# Patient Record
Sex: Male | Born: 1960 | Race: White | Hispanic: No | Marital: Single | Smoking: Current every day smoker
Health system: Southern US, Community
[De-identification: ages and names within clinical notes are randomized; demographics above are authoritative.]

## PROBLEM LIST (undated history)

## (undated) DIAGNOSIS — D696 Thrombocytopenia, unspecified: Secondary | ICD-10-CM

## (undated) DIAGNOSIS — G43909 Migraine, unspecified, not intractable, without status migrainosus: Secondary | ICD-10-CM

## (undated) HISTORY — PX: TONSILLECTOMY: SUR1361

---

## 2009-11-20 ENCOUNTER — Emergency Department: Payer: Self-pay | Admitting: Emergency Medicine

## 2014-01-24 ENCOUNTER — Emergency Department: Payer: Self-pay | Admitting: Emergency Medicine

## 2014-01-24 LAB — COMPREHENSIVE METABOLIC PANEL
ALBUMIN: 3.6 g/dL (ref 3.4–5.0)
ALK PHOS: 75 U/L
Anion Gap: 5 — ABNORMAL LOW (ref 7–16)
BILIRUBIN TOTAL: 0.7 mg/dL (ref 0.2–1.0)
BUN: 8 mg/dL (ref 7–18)
CALCIUM: 8.5 mg/dL (ref 8.5–10.1)
CHLORIDE: 106 mmol/L (ref 98–107)
CREATININE: 0.74 mg/dL (ref 0.60–1.30)
Co2: 31 mmol/L (ref 21–32)
EGFR (African American): >60
Glucose: 86 mg/dL (ref 65–99)
Osmolality: 281 (ref 275–301)
Potassium: 4.3 mmol/L (ref 3.5–5.1)
SGOT(AST): 103 U/L — ABNORMAL HIGH (ref 15–37)
SGPT (ALT): 149 U/L — ABNORMAL HIGH
SODIUM: 142 mmol/L (ref 136–145)
TOTAL PROTEIN: 8.3 g/dL — AB (ref 6.4–8.2)

## 2014-01-24 LAB — URINALYSIS, COMPLETE
Bacteria: NONE SEEN
Bilirubin,UR: NEGATIVE
Glucose,UR: NEGATIVE mg/dL (ref 0–75)
KETONE: NEGATIVE
Leukocyte Esterase: NEGATIVE
Nitrite: POSITIVE
Ph: 7 (ref 4.5–8.0)
Protein: 100
RBC,UR: 23035 /HPF (ref 0–5)
Specific Gravity: 1.009 (ref 1.003–1.030)
Squamous Epithelial: NONE SEEN
WBC UR: 682 /HPF (ref 0–5)

## 2014-01-24 LAB — PROTIME-INR
INR: 1.1
Prothrombin Time: 14.3 secs (ref 11.5–14.7)

## 2014-01-24 LAB — CBC
HCT: 46 % (ref 40.0–52.0)
HGB: 15.4 g/dL (ref 13.0–18.0)
MCH: 32.2 pg (ref 26.0–34.0)
MCHC: 33.4 g/dL (ref 32.0–36.0)
MCV: 96 fL (ref 80–100)
PLATELETS: 54 10*3/uL — AB (ref 150–440)
RBC: 4.78 10*6/uL (ref 4.40–5.90)
RDW: 13.4 % (ref 11.5–14.5)
WBC: 3.9 10*3/uL (ref 3.8–10.6)

## 2014-01-26 LAB — URINE CULTURE

## 2014-03-11 ENCOUNTER — Emergency Department: Payer: Self-pay | Admitting: Emergency Medicine

## 2014-03-11 LAB — COMPREHENSIVE METABOLIC PANEL
ALBUMIN: 3.5 g/dL (ref 3.4–5.0)
Alkaline Phosphatase: 93 U/L
Anion Gap: 6 — ABNORMAL LOW (ref 7–16)
BUN: 9 mg/dL (ref 7–18)
Bilirubin,Total: 1.8 mg/dL — ABNORMAL HIGH (ref 0.2–1.0)
CO2: 27 mmol/L (ref 21–32)
Calcium, Total: 8.3 mg/dL — ABNORMAL LOW (ref 8.5–10.1)
Chloride: 107 mmol/L (ref 98–107)
Creatinine: 0.69 mg/dL (ref 0.60–1.30)
EGFR (African American): >60
Glucose: 90 mg/dL (ref 65–99)
Osmolality: 278 (ref 275–301)
Potassium: 4 mmol/L (ref 3.5–5.1)
SGOT(AST): 95 U/L — ABNORMAL HIGH (ref 15–37)
SGPT (ALT): 68 U/L — ABNORMAL HIGH
SODIUM: 140 mmol/L (ref 136–145)
Total Protein: 8.2 g/dL (ref 6.4–8.2)

## 2014-03-11 LAB — CBC
HCT: 41.9 % (ref 40.0–52.0)
HGB: 14.2 g/dL (ref 13.0–18.0)
MCH: 32.5 pg (ref 26.0–34.0)
MCHC: 34 g/dL (ref 32.0–36.0)
MCV: 96 fL (ref 80–100)
PLATELETS: 54 10*3/uL — AB (ref 150–440)
RBC: 4.37 10*6/uL — ABNORMAL LOW (ref 4.40–5.90)
RDW: 14 % (ref 11.5–14.5)
WBC: 3.2 10*3/uL — AB (ref 3.8–10.6)

## 2014-03-11 LAB — URINALYSIS, COMPLETE
Bacteria: NONE SEEN
RBC,UR: 28497 /HPF (ref 0–5)
Specific Gravity: 1.017 (ref 1.003–1.030)
Squamous Epithelial: NONE SEEN
WBC UR: 1964 /HPF (ref 0–5)

## 2015-07-05 ENCOUNTER — Observation Stay
Admission: EM | Admit: 2015-07-05 | Discharge: 2015-07-06 | Disposition: A | Discharge status: Home / Self Care | Payer: Self-pay | Attending: Internal Medicine | Admitting: Internal Medicine

## 2015-07-05 ENCOUNTER — Encounter: Payer: Self-pay | Admitting: Emergency Medicine

## 2015-07-05 ENCOUNTER — Observation Stay: Payer: Self-pay

## 2015-07-05 DIAGNOSIS — K7460 Unspecified cirrhosis of liver: Secondary | ICD-10-CM | POA: Insufficient documentation

## 2015-07-05 DIAGNOSIS — F17210 Nicotine dependence, cigarettes, uncomplicated: Secondary | ICD-10-CM | POA: Insufficient documentation

## 2015-07-05 DIAGNOSIS — R310 Gross hematuria: Secondary | ICD-10-CM

## 2015-07-05 DIAGNOSIS — N200 Calculus of kidney: Secondary | ICD-10-CM | POA: Insufficient documentation

## 2015-07-05 DIAGNOSIS — G43909 Migraine, unspecified, not intractable, without status migrainosus: Secondary | ICD-10-CM | POA: Insufficient documentation

## 2015-07-05 DIAGNOSIS — R1031 Right lower quadrant pain: Secondary | ICD-10-CM

## 2015-07-05 DIAGNOSIS — R319 Hematuria, unspecified: Secondary | ICD-10-CM

## 2015-07-05 DIAGNOSIS — D696 Thrombocytopenia, unspecified: Secondary | ICD-10-CM

## 2015-07-05 DIAGNOSIS — R1030 Lower abdominal pain, unspecified: Secondary | ICD-10-CM | POA: Insufficient documentation

## 2015-07-05 DIAGNOSIS — K760 Fatty (change of) liver, not elsewhere classified: Secondary | ICD-10-CM | POA: Insufficient documentation

## 2015-07-05 DIAGNOSIS — D72819 Decreased white blood cell count, unspecified: Secondary | ICD-10-CM | POA: Insufficient documentation

## 2015-07-05 DIAGNOSIS — I8500 Esophageal varices without bleeding: Secondary | ICD-10-CM | POA: Insufficient documentation

## 2015-07-05 DIAGNOSIS — K766 Portal hypertension: Secondary | ICD-10-CM | POA: Insufficient documentation

## 2015-07-05 DIAGNOSIS — Z87442 Personal history of urinary calculi: Secondary | ICD-10-CM | POA: Insufficient documentation

## 2015-07-05 DIAGNOSIS — N3289 Other specified disorders of bladder: Secondary | ICD-10-CM | POA: Insufficient documentation

## 2015-07-05 DIAGNOSIS — Z466 Encounter for fitting and adjustment of urinary device: Secondary | ICD-10-CM

## 2015-07-05 DIAGNOSIS — R161 Splenomegaly, not elsewhere classified: Secondary | ICD-10-CM | POA: Insufficient documentation

## 2015-07-05 DIAGNOSIS — K745 Biliary cirrhosis, unspecified: Secondary | ICD-10-CM | POA: Insufficient documentation

## 2015-07-05 HISTORY — DX: Migraine, unspecified, not intractable, without status migrainosus: G43.909

## 2015-07-05 HISTORY — DX: Thrombocytopenia, unspecified: D69.6

## 2015-07-05 LAB — CBC WITH DIFFERENTIAL/PLATELET
BASOS ABS: 0 10*3/uL (ref 0–0.1)
EOS ABS: 0.1 10*3/uL (ref 0–0.7)
Eosinophils Relative: 2 %
HEMATOCRIT: 39.9 % — AB (ref 40.0–52.0)
HEMOGLOBIN: 13.8 g/dL (ref 13.0–18.0)
Lymphocytes Relative: 31 %
Lymphs Abs: 0.8 10*3/uL — ABNORMAL LOW (ref 1.0–3.6)
MCH: 32.7 pg (ref 26.0–34.0)
MCHC: 34.5 g/dL (ref 32.0–36.0)
MCV: 94.7 fL (ref 80.0–100.0)
Monocytes Absolute: 0.2 10*3/uL (ref 0.2–1.0)
NEUTROS ABS: 1.4 10*3/uL (ref 1.4–6.5)
Platelets: 53 10*3/uL — ABNORMAL LOW (ref 150–440)
RBC: 4.21 MIL/uL — AB (ref 4.40–5.90)
RDW: 14.8 % — ABNORMAL HIGH (ref 11.5–14.5)
WBC: 2.5 10*3/uL — AB (ref 3.8–10.6)

## 2015-07-05 LAB — COMPREHENSIVE METABOLIC PANEL
ALBUMIN: 3.8 g/dL (ref 3.5–5.0)
ALK PHOS: 70 U/L (ref 38–126)
ALT: 55 U/L (ref 17–63)
ANION GAP: 2 — AB (ref 5–15)
AST: 59 U/L — AB (ref 15–41)
BILIRUBIN TOTAL: 0.6 mg/dL (ref 0.3–1.2)
BUN: 16 mg/dL (ref 6–20)
CALCIUM: 8.7 mg/dL — AB (ref 8.9–10.3)
CO2: 26 mmol/L (ref 22–32)
CREATININE: 0.61 mg/dL (ref 0.61–1.24)
Chloride: 109 mmol/L (ref 101–111)
GFR calc Af Amer: >60 mL/min (ref >60)
GFR calc non Af Amer: >60 mL/min (ref >60)
GLUCOSE: 86 mg/dL (ref 65–99)
Potassium: 3.9 mmol/L (ref 3.5–5.1)
SODIUM: 137 mmol/L (ref 135–145)
TOTAL PROTEIN: 7.7 g/dL (ref 6.5–8.1)

## 2015-07-05 LAB — URINALYSIS COMPLETE WITH MICROSCOPIC (ARMC ONLY)
BACTERIA UA: NONE SEEN
BILIRUBIN URINE: NEGATIVE
GLUCOSE, UA: NEGATIVE mg/dL
Ketones, ur: NEGATIVE mg/dL
Leukocytes, UA: NEGATIVE
NITRITE: NEGATIVE
Protein, ur: 100 mg/dL — AB
Specific Gravity, Urine: 1.019 (ref 1.005–1.030)
Squamous Epithelial / LPF: NONE SEEN
pH: 6 (ref 5.0–8.0)

## 2015-07-05 LAB — PROTIME-INR
INR: 1.16
PROTHROMBIN TIME: 15 s (ref 11.4–15.0)

## 2015-07-05 MED ORDER — MORPHINE SULFATE (PF) 2 MG/ML IV SOLN
2.0000 mg | INTRAVENOUS | Status: DC | PRN
  Administered 2015-07-05 – 2015-07-06 (×3): 2 mg via INTRAVENOUS
  Filled 2015-07-05 (×3): qty 1

## 2015-07-05 MED ORDER — LEVOFLOXACIN IN D5W 500 MG/100ML IV SOLN
500.0000 mg | INTRAVENOUS | Status: DC
  Administered 2015-07-05: 500 mg via INTRAVENOUS
  Filled 2015-07-05 (×2): qty 100

## 2015-07-05 MED ORDER — KCL IN DEXTROSE-NACL 20-5-0.45 MEQ/L-%-% IV SOLN
INTRAVENOUS | Status: DC
  Administered 2015-07-05 – 2015-07-06 (×2): via INTRAVENOUS
  Filled 2015-07-05 (×4): qty 1000

## 2015-07-05 MED ORDER — MORPHINE SULFATE (PF) 4 MG/ML IV SOLN
4.0000 mg | Freq: Once | INTRAVENOUS | Status: AC
  Administered 2015-07-05: 4 mg via INTRAVENOUS
  Filled 2015-07-05: qty 1

## 2015-07-05 MED ORDER — DOCUSATE SODIUM 100 MG PO CAPS
100.0000 mg | ORAL_CAPSULE | Freq: Two times a day (BID) | ORAL | Status: DC
  Administered 2015-07-05: 100 mg via ORAL
  Filled 2015-07-05: qty 1

## 2015-07-05 MED ORDER — ONDANSETRON HCL 4 MG/2ML IJ SOLN: 4.0000 mg | Freq: Four times a day (QID) | INTRAMUSCULAR | Status: DC | PRN

## 2015-07-05 MED ORDER — ACETAMINOPHEN 325 MG PO TABS: 650.0000 mg | ORAL_TABLET | Freq: Four times a day (QID) | ORAL | Status: DC | PRN

## 2015-07-05 MED ORDER — BISACODYL 10 MG RE SUPP: 10.0000 mg | Freq: Every day | RECTAL | Status: DC | PRN

## 2015-07-05 MED ORDER — ACETAMINOPHEN 650 MG RE SUPP: 650.0000 mg | Freq: Four times a day (QID) | RECTAL | Status: DC | PRN

## 2015-07-05 MED ORDER — IOPAMIDOL (ISOVUE-300) INJECTION 61%
125.0000 mL | Freq: Once | INTRAVENOUS | Status: AC | PRN
  Administered 2015-07-05: 125 mL via INTRAVENOUS

## 2015-07-05 MED ORDER — ONDANSETRON HCL 4 MG PO TABS: 4.0000 mg | ORAL_TABLET | Freq: Four times a day (QID) | ORAL | Status: DC | PRN

## 2015-07-05 MED ORDER — ONDANSETRON HCL 4 MG/2ML IJ SOLN
4.0000 mg | Freq: Once | INTRAMUSCULAR | Status: AC
  Administered 2015-07-05: 4 mg via INTRAVENOUS
  Filled 2015-07-05: qty 2

## 2015-07-05 NOTE — H&P (Signed)
History and Physical    Andrew Davidsonodd W Gargis VHQ:[id number] DOB: 1960-12-27 DOA: 07/05/2015  Referring physician: Dr. Darnelle CatalanMalinda PCP: No primary care provider on file.  Specialists: none  Chief Complaint: difficulty passing urine with blood in the urine  HPI: Andrew Maynard is a 55 y.o. male has a past medical history significant for migraines and chronic thrombocytopenia now with acute onset lower abdominal pain with gross hematuria and urinary hesistancy. No fever. Denies N/V/D. Had similar episode 3 weeks ago with no definitive dx.  In the ER, foley catheter was placed by Urology with improvement of pain but patient noted to have low platelets. He is now admitted  Review of Systems: The patient denies anorexia, fever, weight loss,, vision loss, decreased hearing, hoarseness, chest pain, syncope, dyspnea on exertion, peripheral edema, balance deficits, hemoptysis, melena, hematochezia, severe indigestion/heartburn,, incontinence, genital sores, muscle weakness, suspicious skin lesions, transient blindness, difficulty walking, depression, unusual weight change, abnormal bleeding, enlarged lymph nodes, angioedema, and breast masses.   Past Medical History  Diagnosis Date  . Migraine   . Thrombocytopenia [facility](HCC)    Past Surgical History  Procedure Laterality Date  . Tonsillectomy     Social History:  reports that he has been smoking Cigarettes.  He has been smoking about 1.00 pack per day. He does not have any smokeless tobacco history on file. He reports that he does not drink alcohol. His drug history is not on file.  No Known Allergies  Family History  Problem Relation Age of Onset  . Hypertension Father     Prior to Admission medications   Not on File   Physical Exam: Filed Vitals:   07/05/15 1915 07/05/15 1930 07/05/15 1945 07/05/15 1959  BP: 141/92 129/81 143/95 143/95  Pulse: 61 53 57 57  Temp:      Resp:    16  Height:      Weight:      SpO2: 99% 98% 99% 100%     General:   No apparent distress, WDWN, [place]/AT  Eyes: PERRL, EOMI, no scleral icterus, conjunctiva clear  ENT: moist oropharynx without lesions, TM's benign, dentition good  Neck: supple, no lymphadenopathy  Cardiovascular: regular rate without MRG; 2+ peripheral pulses, no JVD, no peripheral edema  Respiratory: CTA biL, good air movement without wheezing, rhonchi or crackled, respiratory effort normal  Abdomen: soft, tender to palpation in the lower abdomen, positive bowel sounds, no guarding, no rebound, no organomegaly  Skin: no rashes or lesions  Musculoskeletal: normal bulk and tone, no joint swelling  Psychiatric: normal mood and affect, A&OX3  Neurologic: CN 2-12 grossly intact, Motor strength 5/5 in all 4 groups with symmetric DTR's and non-focal sensory exam  Labs on Admission:  Basic Metabolic Panel:  Recent Labs Lab 07/05/15 1826  NA 137  K 3.9  CL 109  CO2 26  GLUCOSE 86  BUN 16  CREATININE 0.61  CALCIUM 8.7*   Liver Function Tests:  Recent Labs Lab 07/05/15 1826  AST 59*  ALT 55  ALKPHOS 70  BILITOT 0.6  PROT 7.7  ALBUMIN 3.8   No results for input(s): LIPASE, AMYLASE in the last 168 hours. No results for input(s): AMMONIA in the last 168 hours. CBC:  Recent Labs Lab 07/05/15 1826  WBC 2.5*  NEUTROABS 1.4  HGB 13.8  HCT 39.9*  MCV 94.7  PLT 53*   Cardiac Enzymes: No results for input(s): CKTOTAL, CKMB, CKMBINDEX, TROPONINI in the last 168 hours.  BNP (last 3 results) No  results for input(s): BNP in the last 8760 hours.  ProBNP (last 3 results) No results for input(s): PROBNP in the last 8760 hours.  CBG: No results for input(s): GLUCAP in the last 168 hours.  Radiological Exams on Admission: No results found.  EKG: Independently reviewed.  Assessment/Plan Principal Problem:   Hematuria, gross Active Problems:   Thrombocytopenia (HCC)   Gross hematuria   Leukopenia   Will admit to floor with IV fluids and empiric IV ABX. Consult  Urology and hematology. Repeat labs in AM. CT of abdomen tonight pending.  Diet: regular Fluids: per Urology DVT Prophylaxis: none  Code Status: FULL  Family Communication: none  Disposition Plan: home  Time spent: 50 min

## 2015-07-05 NOTE — ED Notes (Signed)
3000cc given via CBI, 3600cc returned.

## 2015-07-05 NOTE — ED Provider Notes (Signed)
[facility] Emergency Department Provider Note  ____________________________________________  Time seen: Approximately 5:36 PM  I have reviewed the triage vital signs and the nursing notes.   HISTORY  Chief Complaint Hematuria    HPI ALIEU FINNIGAN is a 55 y.o. male who reports blood in his urine and inability to P. He says he gets this periodically usually once every couple years and they put a Foley in him and irrigated continuously for a couple days and he gets better. However he just had this 3 weeks ago. He says he is in a lot of pain because he has not been out of piece and 7 this morning. No one is ever been and we'll find out why he is having this blood in his urine.   Past Medical History  Diagnosis Date  . Migraine   . Thrombocytopenia [facility])     Patient Active Problem List   Diagnosis Date Noted  . Hematuria, gross 07/05/2015  . Thrombocytopenia (HCC) 07/05/2015  . Gross hematuria 07/05/2015  . Leukopenia 07/05/2015    Past Surgical History  Procedure Laterality Date  . Tonsillectomy      No current outpatient prescriptions on file.  Allergies Review of patient's allergies indicates no known allergies.  Family History  Problem Relation Age of Onset  . Hypertension Father     Social History Social History  Substance Use Topics  . Smoking status: Current Every Day Smoker -- 1.00 packs/day    Types: Cigarettes  . Smokeless tobacco: None  . Alcohol Use: No    Review of Systems Constitutional: No fever/chills Eyes: No visual changes. ENT: No sore throat. Cardiovascular: Denies chest pain. Respiratory: Denies shortness of breath. Gastrointestinal: No abdominal pain.  No nausea, no vomiting.  No diarrhea.  No constipation. Genitourinary: Negative for dysuria. Musculoskeletal: Negative for back pain. Skin: Negative for rash. Neurological: Negative for headaches, focal weakness or numbness.  10-point ROS otherwise  negative.  ____________________________________________   PHYSICAL EXAM:  VITAL SIGNS: ED Triage Vitals  Enc Vitals Group     BP 07/05/15 1339 121/72 mmHg     Pulse --      Resp 07/05/15 1339 18     Temp 07/05/15 1339 97.9 F (36.6 C)     Temp src --      SpO2 07/05/15 1339 96 %     Weight 07/05/15 1339 155 lb (70.308 kg)     Height 07/05/15 1339  (1.88 m)     Head Cir --      Peak Flow --      Pain Score 07/05/15 1340 9     Pain Loc --      Pain Edu? --      Excl. in GC? --     Constitutional: Alert and oriented. Well appearing and in no acute distress. Eyes: Conjunctivae are normal. PERRL. EOMI. Head: Atraumatic. Nose: No congestion/rhinnorhea. Mouth/Throat: Mucous membranes are moist.  Oropharynx non-erythematous. Neck: No stridor.  Cardiovascular: Normal rate, regular rhythm. Grossly normal heart sounds.  Good peripheral circulation. Respiratory: Normal respiratory effort.  No retractions. Lungs CTAB. Gastrointestinal: Soft and nontender. No distention. No abdominal bruits. No CVA tenderness. {Musculoskeletal: No lower extremity tenderness nor edema.  No joint effusions. Neurologic:  Normal speech and language. No gross focal neurologic deficits are appreciated. No gait instability. Skin:  Skin is warm, dry and intact. No rash noted. Psychiatric: Mood and affect are normal. Speech and behavior are normal.  ____________________________________________   LABS (all  labs ordered are listed, but only abnormal results are displayed)  Labs Reviewed  URINALYSIS COMPLETEWITH MICROSCOPIC ([facility] ONLY) - Abnormal; Notable for the following:    Color, Urine AMBER (*)    APPearance CLOUDY (*)    Hgb urine dipstick 3+ (*)    Protein, ur 100 (*)    All other components within normal limits  COMPREHENSIVE METABOLIC PANEL - Abnormal; Notable for the following:    Calcium 8.7 (*)    AST 59 (*)    Anion gap 2 (*)    All other components within normal limits  CBC WITH  DIFFERENTIAL/PLATELET - Abnormal; Notable for the following:    WBC 2.5 (*)    RBC 4.21 (*)    HCT 39.9 (*)    RDW 14.8 (*)    Platelets 53 (*)    Lymphs Abs 0.8 (*)    All other components within normal limits  URINE CULTURE  PROTIME-INR  CBC   ____________________________________________  EKG   ____________________________________________  RADIOLOGY   ____________________________________________   PROCEDURES  Continuous bladder irrigation ordered and startedUrology comes to see the patient and Dr. Judithann SheenSparks will admit.  ____________________________________________   INITIAL IMPRESSION / ASSESSMENT AND PLAN / ED COURSE  Pertinent labs & imaging results that were available during my care of the patient were reviewed by me and considered in my medical decision making (see chart for details).   ____________________________________________   FINAL CLINICAL IMPRESSION(S) / ED DIAGNOSES  Final diagnoses:  Hematuria   Also chronically low platelets   Arnaldo NatalPaul F Mennie Spiller, MD 07/05/15 2101

## 2015-07-05 NOTE — ED Notes (Signed)
States having blood in urine and difficulty urinating. States had the same problem 3 weeks ago but they never found the cause

## 2015-07-05 NOTE — Consult Note (Signed)
Subjective: CC: Hematuria  Hx: Mr. Knoth is a 55 yo WM who I was asked to see in consultation by Dr. Darnelle Catalan for gross hematuria.    He has a history of hematuria with a prior episode in 2015.   He had a CT then that showed a tiny stone.  I don't believe he has had cystoscopy.  He was seen in a different ER 3 weeks ago for gross hematuria and was catheterized.  The bleeding resolved and he returns today with the onset this morning of gross hematuria.  He has some vague RLQ pain but no other  Symptoms.   He has a 3 way foley in on fast CBI and the urine is cherry red.  He has a history of chronic thrombocytopenia with a platelet count of 53K and leukopenia with a WBC count of 2.5.   He is 1ppd smoker for 35 years.   ROS:  Review of Systems  Constitutional: Negative for fever.  Gastrointestinal: Negative for nausea.  Genitourinary: Negative for dysuria.  All other systems reviewed and are negative.   No Known Allergies  Past Medical History  Diagnosis Date  . Migraine     History reviewed. No pertinent past surgical history.  Social History   Social History  . Marital Status: Single    Spouse Name: N/A  . Number of Children: N/A  . Years of Education: N/A   Occupational History  . Not on file.   Social History Main Topics  . Smoking status: Current Every Day Smoker -- 1.00 packs/day    Types: Cigarettes  . Smokeless tobacco: Not on file  . Alcohol Use: No  . Drug Use: Not on file  . Sexual Activity: Not on file   Other Topics Concern  . Not on file   Social History Narrative  . No narrative on file    History reviewed. No pertinent family history.  Anti-infectives: Anti-infectives    None      No current facility-administered medications for this encounter.   No current outpatient prescriptions on file.   Past, social and family history reviewed and updated.   Objective: Vital signs in last 24 hours: Temp:  [97.9 F (36.6 C)] 97.9 F (36.6 C)  (04/15 1339) Pulse Rate:  [57-61] 57 (04/15 1900) Resp:  [18] 18 (04/15 1339) BP: (120-140)/(72-98) 130/93 mmHg (04/15 1900) SpO2:  [96 %-100 %] 100 % (04/15 1900) Weight:  [70.308 kg (155 lb)] 70.308 kg (155 lb) (04/15 1339)  Intake/Output from previous day:   Intake/Output this shift: Total I/O In: 3000 [Other:3000] Out: 3750 [Urine:3750]   Physical Exam  Constitutional: He is oriented to person, place, and time and well-developed, well-nourished, and in no distress.  HENT:  Head: Normocephalic and atraumatic.  Neck: Normal range of motion. Neck supple.  Cardiovascular: Normal rate, regular rhythm and normal heart sounds.   Pulmonary/Chest: Effort normal and breath sounds normal. No respiratory distress.  Abdominal: Soft. He exhibits distension. He exhibits no mass. There is no tenderness. There is no guarding.  Genitourinary:  Normal phallus with a foley at the meatus.   Scrotum and testes are unremarkable.  He has a nl anus and perineum with NST and no masses.  Prostate is 1-2+ soft and non-tender.  SV's non-palpable.   Musculoskeletal: Normal range of motion. He exhibits no edema or tenderness.  Neurological: He is alert and oriented to person, place, and time.  Skin: Skin is warm and dry.  Psychiatric: Mood and affect  normal.    Lab Results:   Recent Labs  07/05/15 1826  WBC 2.5*  HGB 13.8  HCT 39.9*  PLT 53*   BMET  Recent Labs  07/05/15 1826  NA 137  K 3.9  CL 109  CO2 26  GLUCOSE 86  BUN 16  CREATININE 0.61  CALCIUM 8.7*   PT/INR  Recent Labs  07/05/15 1826  LABPROT 15.0  INR 1.16   ABG No results for input(s): PHART, HCO3 in the last 72 hours.  Invalid input(s): PCO2, PO2  Studies/Results: No results found.  I have discussed his case with Dr. Darnelle CatalanMalinda and reviewed his notes, prior CT films and his labs.  Procedure:  I hand irrigated the foley with return of a few small clots and clearance of the return.  Assessment: He has gross  hematuria of uncertain etiology but has thrombocytopenia and is a smoker.  There was a small stone on his prior CT.   CT AP w/wo IV only. Continue CBI. NPO p MN with possible cystoscopy tomorrow if he continues to bleed.    CC: Dr. Dorothea GlassmanPaul Malinda.      Emani Morad J 07/05/2015 [phone]

## 2015-07-06 DIAGNOSIS — R1031 Right lower quadrant pain: Secondary | ICD-10-CM

## 2015-07-06 DIAGNOSIS — D696 Thrombocytopenia, unspecified: Secondary | ICD-10-CM

## 2015-07-06 DIAGNOSIS — R310 Gross hematuria: Secondary | ICD-10-CM

## 2015-07-06 LAB — CBC
HEMATOCRIT: 36.7 % — AB (ref 40.0–52.0)
Hemoglobin: 12.5 g/dL — ABNORMAL LOW (ref 13.0–18.0)
MCH: 31.7 pg (ref 26.0–34.0)
MCHC: 34 g/dL (ref 32.0–36.0)
MCV: 93.1 fL (ref 80.0–100.0)
Platelets: 45 10*3/uL — ABNORMAL LOW (ref 150–440)
RBC: 3.94 MIL/uL — ABNORMAL LOW (ref 4.40–5.90)
RDW: 14.7 % — AB (ref 11.5–14.5)
WBC: 3.1 10*3/uL — ABNORMAL LOW (ref 3.8–10.6)

## 2015-07-06 LAB — COMPREHENSIVE METABOLIC PANEL
ALBUMIN: 3.1 g/dL — AB (ref 3.5–5.0)
ALT: 45 U/L (ref 17–63)
ANION GAP: 1 — AB (ref 5–15)
AST: 47 U/L — ABNORMAL HIGH (ref 15–41)
Alkaline Phosphatase: 55 U/L (ref 38–126)
BILIRUBIN TOTAL: 0.8 mg/dL (ref 0.3–1.2)
BUN: 13 mg/dL (ref 6–20)
CO2: 24 mmol/L (ref 22–32)
Calcium: 8.2 mg/dL — ABNORMAL LOW (ref 8.9–10.3)
Chloride: 113 mmol/L — ABNORMAL HIGH (ref 101–111)
Creatinine, Ser: 0.56 mg/dL — ABNORMAL LOW (ref 0.61–1.24)
GFR calc Af Amer: >60 mL/min (ref >60)
Glucose, Bld: 94 mg/dL (ref 65–99)
POTASSIUM: 3.9 mmol/L (ref 3.5–5.1)
Sodium: 138 mmol/L (ref 135–145)
TOTAL PROTEIN: 6.4 g/dL — AB (ref 6.5–8.1)

## 2015-07-06 LAB — PROTIME-INR
INR: 1.19
PROTHROMBIN TIME: 15.3 s — AB (ref 11.4–15.0)

## 2015-07-06 MED ORDER — TRAMADOL HCL 50 MG PO TABS
50.0000 mg | ORAL_TABLET | Freq: Once | ORAL | Status: AC
  Administered 2015-07-06: 50 mg via ORAL
  Filled 2015-07-06: qty 1

## 2015-07-06 NOTE — Discharge Summary (Signed)
[facility] [place] [facility]   PATIENT NAME: Andrew Maynard    MR#:  [id number]  DATE OF BIRTH:  1961/01/19  DATE OF ADMISSION:  07/05/2015 ADMITTING PHYSICIAN: Bjorn PippinJohn Wrenn, MD  DATE OF DISCHARGE: 07/06/15  PRIMARY CARE PHYSICIAN: No primary care provider on file.    ADMISSION DIAGNOSIS:  Gross hematuria [R31.0] Hematuria [R31.9]  DISCHARGE DIAGNOSIS:  Hematuria-resolved. Further w/u as outpt Chronic migraine headache Chronic TCP likely due to cirrhosis of liver (new findings noted on CT abd) H/o ETOH abuse SECONDARY DIAGNOSIS:   Past Medical History  Diagnosis Date  . Migraine   . Thrombocytopenia [name](HCC)     HOSPITAL COURSE:  [name] is an 55 y.o. male who was admitted 07/05/2015 with a diagnosis of Hematuria, gross and was found to have thrombocytopenia and leukopenia.   1. Gross hematuria -etio unclear, further w/u as outpt -pt received CBI and now output is clear -seen by urology Dr Wilson SingerWren ,ok to go home with outpt f/u  2. Chronic panctyopenia suspected due to cirrhosis of liver (as seen on ct abd) -plt count stable 45-53K -have advised quit ETOH (has not drank in 3 weeks) -f/u PCP  3.migraine headaches Asked to have routine f/u with PCP  D/c home  CONSULTS OBTAINED:  Treatment Team:  Bjorn PippinJohn Wrenn, MD  DRUG ALLERGIES:  No Known Allergies  DISCHARGE MEDICATIONS:  There are no discharge medications for this patient.   If you experience worsening of your admission symptoms, develop shortness of breath, life threatening emergency, suicidal or homicidal thoughts you must seek medical attention immediately by calling 911 or calling your MD immediately  if symptoms less severe.  You Must read complete instructions/literature along with all the possible adverse reactions/side effects for all the Medicines you take and that have been prescribed to you. Take any new Medicines after you have completely understood and accept all the  possible adverse reactions/side effects.   Please note  You were cared for by a hospitalist during your hospital stay. If you have any questions about your discharge medications or the care you received while you were in the hospital after you are discharged, you can call the unit and asked to speak with the hospitalist on call if the hospitalist that took care of you is not available. Once you are discharged, your primary care physician will handle any further medical issues. Please note that NO REFILLS for any discharge medications will be authorized once you are discharged, as it is imperative that you return to your primary care physician (or establish a relationship with a primary care physician if you do not have one) for your aftercare needs so that they can reassess your need for medications and monitor your lab values. Today   SUBJECTIVE   No complaints  VITAL SIGNS:  Blood pressure 100/75, pulse 57, temperature 97.7 F (36.5 C), temperature source Oral, resp. rate 18, height 6\' 2"  (1.88 m), weight 69.899 kg (154 lb 1.6 oz), SpO2 97 %.  I/O:   Intake/Output Summary (Last 24 hours) at 07/06/15 0911 Last data filed at 07/06/15 0800  Gross per 24 hour  Intake 13549.44 ml  Output  1324413300 ml  Net 249.44 ml    PHYSICAL EXAMINATION:  GENERAL:  55 y.o.-year-old patient lying in the bed with no acute distress.  EYES: Pupils equal, round, reactive to light and accommodation. No scleral icterus. Extraocular muscles intact.  HEENT: Head atraumatic, normocephalic. Oropharynx and nasopharynx clear.  NECK:  Supple,  no jugular venous distention. No thyroid enlargement, no tenderness.  LUNGS: Normal breath sounds bilaterally, no wheezing, rales,rhonchi or crepitation. No use of accessory muscles of respiration.  CARDIOVASCULAR: S1, S2 normal. No murmurs, rubs, or gallops.  ABDOMEN: Soft, non-tender, non-distended. Bowel sounds present. No organomegaly or mass.  EXTREMITIES: No pedal edema,  cyanosis, or clubbing.  NEUROLOGIC: Cranial nerves II through XII are intact. Muscle strength 5/5 in all extremities. Sensation intact. Gait not checked.  PSYCHIATRIC: The patient is alert and oriented x 3.  SKIN: No obvious rash, lesion, or ulcer.   DATA REVIEW:   CBC   Recent Labs Lab 07/06/15 0615  WBC 3.1*  HGB 12.5*  HCT 36.7*  PLT 45*    Chemistries   Recent Labs Lab 07/06/15 0615  NA 138  K 3.9  CL 113*  CO2 24  GLUCOSE 94  BUN 13  CREATININE 0.56*  CALCIUM 8.2*  AST 47*  ALT 45  ALKPHOS 55  BILITOT 0.8    Microbiology Results   Recent Results (from the past 240 hour(s))  Urine culture     Status: None (Preliminary result)   Collection Time: 07/05/15  6:26 PM  Result Value Ref Range Status   Specimen Description URINE, RANDOM  Final   Special Requests Normal  Final   Culture NO GROWTH < 12 HOURS  Final   Report Status PENDING  Incomplete    RADIOLOGY:  Ct Abdomen Pelvis W Wo Contrast  07/05/2015  CLINICAL DATA:  Gross hematuria. Vague right lower quadrant pain. History of nephrolithiasis. EXAM: CT ABDOMEN AND PELVIS WITHOUT AND WITH CONTRAST TECHNIQUE: Multidetector CT imaging of the abdomen and pelvis was performed following the standard protocol before and following the bolus administration of intravenous contrast. CONTRAST:  ISOVUE-300 IOPAMIDOL (ISOVUE-300) INJECTION 61% COMPARISON:  03/11/2014 CT abdomen/ pelvis. FINDINGS: Lower chest: No significant pulmonary nodules or acute consolidative airspace disease. Hepatobiliary: Mild diffuse hepatic steatosis. Finely irregular liver surface, indicating cirrhosis. No liver mass. Paraumbilical and paraesophageal varices. Normal gallbladder with no radiopaque cholelithiasis. No biliary ductal dilatation. Pancreas:  Normal, with no pancreatic mass or duct dilation. Spleen: Moderate splenomegaly (craniocaudal splenic length 16.9 cm, previously 15.6 cm, mildly increased). No splenic mass. Adrenals/Urinary  Tract: Normal adrenals. At least 2 punctate nonobstructing 1 mm stones in the interpolar left kidney. No right renal stones. No hydronephrosis. No renal cortical masses. On delayed imaging, there is no urothelial wall thickening and there are no filling defects in the opacified portions of the bilateral collecting systems or ureters, noting non opacification of significant portions of the right greater than left ureters, limiting evaluation in these locations. No bladder wall thickening. Multiple small hyperdense structures in the bladder, at least 1 of which appears to layer on the prone delayed sequence, probably representing blood clots. Urinary bladder is nearly collapsed by indwelling Foley catheter. Stomach/Bowel: Grossly normal stomach. Normal caliber small bowel with no small bowel wall thickening. Normal appendix. Normal large bowel with no diverticulosis, large bowel wall thickening or pericolonic fat stranding. Vascular/Lymphatic: Atherosclerotic nonaneurysmal abdominal aorta. Patent portal, splenic, hepatic and renal veins. No pathologically enlarged lymph nodes in the abdomen or pelvis. Reproductive: Normal size prostate. Other: No pneumoperitoneum, ascites or focal fluid collection. Musculoskeletal: No aggressive appearing focal osseous lesions. Mild degenerative changes in the visualized thoracolumbar spine. IMPRESSION: 1. Punctate nonobstructing left renal stones.  No hydronephrosis . 2. No renal cortical masses. No upper tract urothelial lesions, with limitations as described. 3. Several small hyperdense structures in the  posterior bladder, probably blood clots, recommend correlation with cystoscopy to exclude a bladder lesion. 4. No evidence of bowel obstruction or acute bowel inflammation. Normal appendix. 5. Cirrhosis. Diffuse hepatic steatosis. Sequela of chronic portal hypertension including paraesophageal and paraumbilical varices and moderate splenomegaly, mildly increased. No ascites.  Electronically Signed   By: Delbert Phenix M.D.   On: 07/05/2015 22:46     Management plans discussed with the patient, family and they are in agreement.  CODE STATUS:     Code Status Orders        Start     Ordered   07/05/15 2258  Full code   Continuous     07/05/15 2257    Code Status History    Date Active Date Inactive Code Status Order ID Comments User Context   07/05/2015  8:12 PM 07/05/2015 10:57 PM Full Code [id number]  Bjorn Pippin, MD ED      TOTAL TIME TAKING CARE OF THIS PATIENT: 40 minutes.    Lujean Ebright M.D on 07/06/2015 at 9:11 AM  Between 7am to 6pm - Pager - [phone] After 6pm go to [email or web address] - password [email or web address] [facility]  [place] [facility]  Office  [phone]  CC: Primary care physician; No primary care provider on file.

## 2015-07-06 NOTE — Discharge Instructions (Signed)
Hematuria, Adult Hematuria is blood in your urine. It can be caused by a bladder infection, kidney infection, prostate infection, kidney stone, or cancer of your urinary tract. Infections can usually be treated with medicine, and a kidney stone usually will pass through your urine. If neither of these is the cause of your hematuria, further workup to find out the reason may be needed. It is very important that you tell your health care provider about any blood you see in your urine, even if the blood stops without treatment or happens without causing pain. Blood in your urine that happens and then stops and then happens again can be a symptom of a very serious condition. Also, pain is not a symptom in the initial stages of many urinary cancers. HOME CARE INSTRUCTIONS   Drink lots of fluid, 3-4 quarts a day. If you have been diagnosed with an infection, cranberry juice is especially recommended, in addition to large amounts of water.  Avoid caffeine, tea, and carbonated beverages because they tend to irritate the bladder.  Avoid alcohol because it may irritate the prostate.  Take all medicines as directed by your health care provider.  If you were prescribed an antibiotic medicine, finish it all even if you start to feel better.  If you have been diagnosed with a kidney stone, follow your health care provider's instructions regarding straining your urine to catch the stone.  Empty your bladder often. Avoid holding urine for long periods of time.  After a bowel movement, women should cleanse front to back. Use each tissue only once.  Empty your bladder before and after sexual intercourse if you are a male. SEEK MEDICAL CARE IF:  You develop back pain.  You have a fever.  You have a feeling of sickness in your stomach (nausea) or vomiting.  Your symptoms are not better in 3 days. Return sooner if you are getting worse. SEEK IMMEDIATE MEDICAL CARE IF:   You develop severe vomiting and  are unable to keep the medicine down.  You develop severe back or abdominal pain despite taking your medicines.  You begin passing a large amount of blood or clots in your urine.  You feel extremely weak or faint, or you pass out. MAKE SURE YOU:   Understand these instructions.  Will watch your condition.  Will get help right away if you are not doing well or get worse.   This information is not intended to replace advice given to you by your health care provider. Make sure you discuss any questions you have with your health care provider.   Document Released: 03/08/2005 Document Revised: 03/29/2014 Document Reviewed: 11/06/2012 Elsevier Interactive Patient Education 2016 [employer]  Please avoid aspirin, aleve, naprosyn and ibuprofen products.

## 2015-07-06 NOTE — Discharge Summary (Signed)
Physician Discharge Summary  Patient ID: Andrew Maynard MRN: [id number] DOB/AGE: 08/12/1960 55 y.o.  Admit date: 07/05/2015 Discharge date: 07/06/2015  Admission Diagnoses:  Hematuria, gross  Discharge Diagnoses:  Principal Problem:   Hematuria, gross Active Problems:   Thrombocytopenia (HCC)   Gross hematuria   Leukopenia   Past Medical History  Diagnosis Date  . Migraine   . Thrombocytopenia (HCC)     Surgeries:  on * No surgery found *   Consultants (if any): Treatment Team:  Bjorn PippinJohn Virga Haltiwanger, MD  Discharged Condition: Improved  Hospital Course: Andrew Maynard is an 55 y.o. male who was admitted 07/05/2015 with a diagnosis of Hematuria, gross and was found to have thrombocytopenia and leukopenia.   He was irrigated free of clots and kept on CBI and his UA is clear this morning.   A CT showed only punctate renal stones and no obvious renal or bladder lesions.  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  He was given levaquin but his urine culture is no growth.   He was to have a hematology consult called in.   I have d/c'd the foley and IV and will discharge him [facility] possible cystoscopy.   If hematology is not to see him today he will need to be set up as an outpatient.   He was given perioperative antibiotics:  Anti-infectives    Start     Dose/Rate Route Frequency Ordered Stop   07/05/15 2015  levofloxacin (LEVAQUIN) IVPB 500 mg     500 mg 100 mL/hr over 60 Minutes Intravenous Every 24 hours 07/05/15 2007      .  He was given sequential compression devices  for DVT prophylaxis.  He benefited maximally from the hospital stay and there were no complications.    Recent vital signs:  Filed Vitals:   07/06/15 0530 07/06/15 0820  BP: 95/62 100/75  Pulse: 66 57  Temp: 97.7 F (36.5 C)   Resp: 20 18    Recent laboratory studies:  Lab Results  Component Value Date   HGB 12.5* 07/06/2015   HGB 13.8  07/05/2015   HGB 14.2 03/11/2014   Lab Results  Component Value Date   WBC 3.1* 07/06/2015   PLT 45* 07/06/2015   Lab Results  Component Value Date   INR 1.19 07/06/2015   Lab Results  Component Value Date   NA 138 07/06/2015   K 3.9 07/06/2015   CL 113* 07/06/2015   CO2 24 07/06/2015   BUN 13 07/06/2015   CREATININE 0.56* 07/06/2015   GLUCOSE 94 07/06/2015    Discharge Medications:     Medication List    Notice    You have not been prescribed any medications.      Diagnostic Studies: Ct Abdomen Pelvis W Wo Contrast  07/05/2015  CLINICAL DATA:  Gross hematuria. Vague right lower quadrant pain. History of nephrolithiasis. EXAM: CT ABDOMEN AND PELVIS WITHOUT AND WITH CONTRAST TECHNIQUE: Multidetector CT imaging of the abdomen and pelvis was performed following the standard protocol before and following the bolus administration of intravenous contrast. CONTRAST:  125mL ISOVUE-300 IOPAMIDOL (ISOVUE-300) INJECTION 61% COMPARISON:  03/11/2014 CT abdomen/ pelvis. FINDINGS: Lower chest: No significant pulmonary nodules or acute consolidative airspace disease. Hepatobiliary: Mild diffuse hepatic steatosis. Finely irregular liver surface, indicating cirrhosis. No liver mass. Paraumbilical and paraesophageal varices. Normal gallbladder with no radiopaque cholelithiasis. No biliary ductal dilatation. Pancreas:  Normal, with no pancreatic mass or  duct dilation. Spleen: Moderate splenomegaly (craniocaudal splenic length 16.9 cm, previously 15.6 cm, mildly increased). No splenic mass. Adrenals/Urinary Tract: Normal adrenals. At least 2 punctate nonobstructing 1 mm stones in the interpolar left kidney. No right renal stones. No hydronephrosis. No renal cortical masses. On delayed imaging, there is no urothelial wall thickening and there are no filling defects in the opacified portions of the bilateral collecting systems or ureters, noting non opacification of significant portions of the right  greater than left ureters, limiting evaluation in these locations. No bladder wall thickening. Multiple small hyperdense structures in the bladder, at least 1 of which appears to layer on the prone delayed sequence, probably representing blood clots. Urinary bladder is nearly collapsed by indwelling Foley catheter. Stomach/Bowel: Grossly normal stomach. Normal caliber small bowel with no small bowel wall thickening. Normal appendix. Normal large bowel with no diverticulosis, large bowel wall thickening or pericolonic fat stranding. Vascular/Lymphatic: Atherosclerotic nonaneurysmal abdominal aorta. Patent portal, splenic, hepatic and renal veins. No pathologically enlarged lymph nodes in the abdomen or pelvis. Reproductive: Normal size prostate. Other: No pneumoperitoneum, ascites or focal fluid collection. Musculoskeletal: No aggressive appearing focal osseous lesions. Mild degenerative changes in the visualized thoracolumbar spine. IMPRESSION: 1. Punctate nonobstructing left renal stones.  No hydronephrosis . 2. No renal cortical masses. No upper tract urothelial lesions, with limitations as described. 3. Several small hyperdense structures in the posterior bladder, probably blood clots, recommend correlation with cystoscopy to exclude a bladder lesion. 4. No evidence of bowel obstruction or acute bowel inflammation. Normal appendix. 5. Cirrhosis. Diffuse hepatic steatosis. Sequela of chronic portal hypertension including paraesophageal and paraumbilical varices and moderate splenomegaly, mildly increased. No ascites. Electronically Signed   By: Delbert Phenix M.D.   On: 07/05/2015 22:46    Disposition: Final discharge disposition not confirmed  He will be discharged home.        Discharge Instructions    Discharge patient    Complete by:  As directed   Discharge home after seen by hematology if they are going to come by today.     Discontinue IV    Complete by:  As directed      Foley catheter -  discontinue    Complete by:  As directed            Follow-up Information    Follow up with Vanna Scotland, MD.   Specialty:  Urology   Why:  The office will contact you to set up an appointment.    Contact information:   [address] [place] [address] [place] [address] [phone]        Signed: Anner Crete 07/06/2015, 8:49 AM

## 2015-07-07 ENCOUNTER — Telehealth: Payer: Self-pay | Admitting: Urology

## 2015-07-07 LAB — URINE CULTURE
Culture: NO GROWTH
Special Requests: NORMAL

## 2015-07-07 NOTE — Telephone Encounter (Signed)
Made appt for 07-25-15 @ 2:30 w/ dr. Sherryl Bartersbudzyn for a new patient Andrew Maynard/cysto appt per dr. [name]. Patient's vm was not set up. Mailed np pw and will try to call pt back.   michelle

## 2015-07-08 NOTE — Telephone Encounter (Signed)
I called the patient to make his follow up appointment and was told that he has gone back home to [place] and will not be back for any follow up care. He will be getting care from his doctor there.   Thanks,  Marcelino DusterMichelle

## 2015-07-25 ENCOUNTER — Other Ambulatory Visit: Payer: Self-pay

## 2017-06-04 ENCOUNTER — Emergency Department
Admission: EM | Admit: 2017-06-04 | Discharge: 2017-06-04 | Disposition: A | Discharge status: Home / Self Care | Payer: BLUE CROSS/BLUE SHIELD | Attending: Emergency Medicine | Admitting: Emergency Medicine

## 2017-06-04 ENCOUNTER — Other Ambulatory Visit: Payer: Self-pay

## 2017-06-04 ENCOUNTER — Emergency Department: Payer: BLUE CROSS/BLUE SHIELD

## 2017-06-04 ENCOUNTER — Encounter: Payer: Self-pay | Admitting: Emergency Medicine

## 2017-06-04 DIAGNOSIS — M940 Chondrocostal junction syndrome [Tietze]: Secondary | ICD-10-CM | POA: Diagnosis not present

## 2017-06-04 DIAGNOSIS — Y939 Activity, unspecified: Secondary | ICD-10-CM | POA: Insufficient documentation

## 2017-06-04 DIAGNOSIS — F17210 Nicotine dependence, cigarettes, uncomplicated: Secondary | ICD-10-CM | POA: Diagnosis not present

## 2017-06-04 DIAGNOSIS — Y999 Unspecified external cause status: Secondary | ICD-10-CM | POA: Diagnosis not present

## 2017-06-04 DIAGNOSIS — W19XXXA Unspecified fall, initial encounter: Secondary | ICD-10-CM | POA: Insufficient documentation

## 2017-06-04 DIAGNOSIS — R0789 Other chest pain: Secondary | ICD-10-CM | POA: Diagnosis present

## 2017-06-04 DIAGNOSIS — Y929 Unspecified place or not applicable: Secondary | ICD-10-CM | POA: Insufficient documentation

## 2017-06-04 MED ORDER — OXYCODONE HCL 5 MG PO TABS
5.0000 mg | ORAL_TABLET | Freq: Once | ORAL | Status: AC
  Administered 2017-06-04: 5 mg via ORAL
  Filled 2017-06-04: qty 1

## 2017-06-04 MED ORDER — MELOXICAM 15 MG PO TABS: 15.0000 mg | ORAL_TABLET | Freq: Every day | ORAL | 0 refills | Status: AC

## 2017-06-04 MED ORDER — OXYCODONE HCL 5 MG PO TABS: 5.0000 mg | ORAL_TABLET | Freq: Three times a day (TID) | ORAL | 0 refills | Status: AC | PRN

## 2017-06-04 NOTE — ED Provider Notes (Signed)
[facility] Emergency Department Provider Note ____________________________________________  Time seen: Approximately 11:18 PM  I have reviewed the triage vital signs and the nursing notes.   HISTORY  Chief Complaint Fall    HPI Andrew Maynard is a 57 y.o. male who presents to the emergency department for evaluation and treatment of anterior wall chest pain that started suddenly after a mechanical, non-syncopal fall a week ago.  He states that since that time he has had a "click sound" when breathing.  He has had no palpitations, syncope/near syncope, cough, or shortness of breath.  No alleviating measures have been attempted for this complaint.  Past Medical History:  Diagnosis Date  . Migraine   . Thrombocytopenia [facility])     Patient Active Problem List   Diagnosis Date Noted  . Hematuria, gross 07/05/2015  . Thrombocytopenia (HCC) 07/05/2015  . Gross hematuria 07/05/2015  . Leukopenia 07/05/2015    Past Surgical History:  Procedure Laterality Date  . TONSILLECTOMY      Prior to Admission medications   Medication Sig Start Date End Date Taking? Authorizing Provider  meloxicam (MOBIC) 15 MG tablet Take 1 tablet (15 mg total) by mouth daily. 06/04/17   Levi Crass B, FNP  oxyCODONE (ROXICODONE) 5 MG immediate release tablet Take 1 tablet (5 mg total) by mouth every 8 (eight) hours as needed. 06/04/17 06/04/18  Chinita Pester, FNP    Allergies Patient has no known allergies.  Family History  Problem Relation Age of Onset  . Hypertension Father     Social History Social History   Tobacco Use  . Smoking status: Current Every Day Smoker    Packs/day: 1.00    Types: Cigarettes  . Smokeless tobacco: Never Used  Substance Use Topics  . Alcohol use: No  . Drug use: No    Review of Systems Constitutional: Negative for fever. Cardiovascular: Negative for chest pain. Respiratory: Negative for shortness of breath. Musculoskeletal: Positive  for chest wall tenderness Skin: Negative for rash, lesion, or wound Neurological: Negative for decrease in sensation  ____________________________________________   PHYSICAL EXAM:  VITAL SIGNS: ED Triage Vitals [06/04/17 2100]  Enc Vitals Group     BP 124/73     Pulse Rate 83     Resp 18     Temp 98.7 F (37.1 C)     Temp Source Oral     SpO2 99 %     Weight 160 lb (72.6 kg)     Height 6\' 2"  (1.88 m)     Head Circumference      Peak Flow      Pain Score 4     Pain Loc      Pain Edu?      Excl. in GC?     Constitutional: Alert and oriented. Well appearing and in no acute distress. Eyes: Conjunctivae are clear without discharge or drainage Head: Atraumatic Neck: Supple Respiratory: No cough. Respirations are even and unlabored. Musculoskeletal: Left anterior chest is tender just below the clavicle.  No flail segment is noted.  No bony abnormality is noted. Neurologic: Awake, alert, oriented x4. Skin: Anterior chest wall is without ecchymosis or contusion.  There is no rash, lesion, or wound on exposed skin surface. Psychiatric: Affect and behavior are appropriate.  ____________________________________________   LABS (all labs ordered are listed, but only abnormal results are displayed)  Labs Reviewed - No data to display ____________________________________________  RADIOLOGY  1 view chest does not reveal any acute bony  or cardiopulmonary abnormality per radiology. ____________________________________________   PROCEDURES  Procedures  ____________________________________________   INITIAL IMPRESSION / ASSESSMENT AND PLAN / ED COURSE  Andrew Maynard is a 57 y.o. who presents to the emergency department for treatment and evaluation of anterior chest wall pain after injury 1 week ago.  Chest x-ray and exam are consistent with a costochondritis and he will be treated with meloxicam and Roxicet IR (#12).  Patient instructed to follow-up with primary care for  symptoms that are not improving over the next few days.  He was also instructed to return to the emergency department for symptoms that change or worsen if unable schedule an appointment   Medications  oxyCODONE (Oxy IR/ROXICODONE) immediate release tablet 5 mg (5 mg Oral Given 06/04/17 2318)    Pertinent labs & imaging results that were available during my care of the patient were reviewed by me and considered in my medical decision making (see chart for details).  _________________________________________   FINAL CLINICAL IMPRESSION(S) / ED DIAGNOSES  Final diagnoses:  Costochondritis    ED Discharge Orders        Ordered    meloxicam (MOBIC) 15 MG tablet  Daily     06/04/17 2315    oxyCODONE (ROXICODONE) 5 MG immediate release tablet  Every 8 hours PRN     06/04/17 2315       If controlled substance prescribed during this visit, 12 month history viewed on the [facility] prior to issuing an initial prescription for Schedule II or III opiod.    Chinita Pesterriplett, Desirae Mancusi B, FNP 06/04/17 2322    Sharman CheekStafford, Phillip, MD 06/04/17 2356

## 2017-06-04 NOTE — ED Triage Notes (Signed)
Pt arrives ambulatory to triage with c/o chest wall pain due to a fall from some rafters onto a wall last Saturday. Pt denies LOC or head trauma but is hearing a "clicky sound" in his chest and feel like something is wrong with his bones. Pt is in NAD and denies any cardiac symptoms at this time.

## 2017-08-28 IMAGING — CT CT ABD-PEL WO/W CM
2 of 10 series · 10 of 46 positions shown, 16 images · IV contrast (iopamidol)
Comparison: 03/11/2014 CT abdomen/ pelvis.

CLINICAL DATA: Gross hematuria. Vague right lower quadrant pain.
History of nephrolithiasis.

EXAM:
CT ABDOMEN AND PELVIS WITHOUT AND WITH CONTRAST
TECHNIQUE: Multidetector CT imaging of the abdomen and pelvis was performed
following the standard protocol before and following the bolus
administration of intravenous contrast.
CONTRAST:  125mL QRFUGN-B77 IOPAMIDOL (QRFUGN-B77) INJECTION 61%

[Series 2: soft tissue · axial · 0.66mm/px · z∈[-518,-164]mm · 8 of 93 slices shown, 13 images]
[im 11/93  soft-tissue]
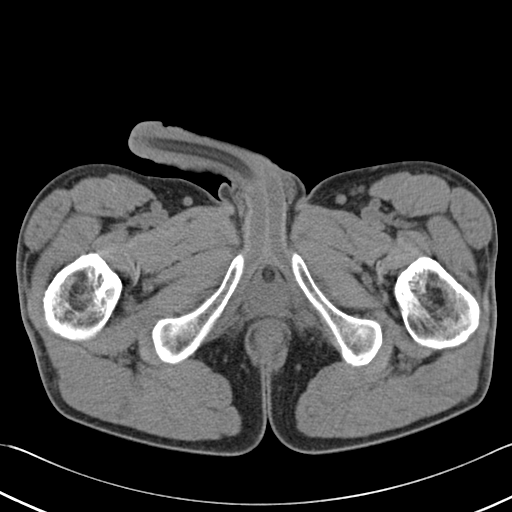
[im 11/93  bone]
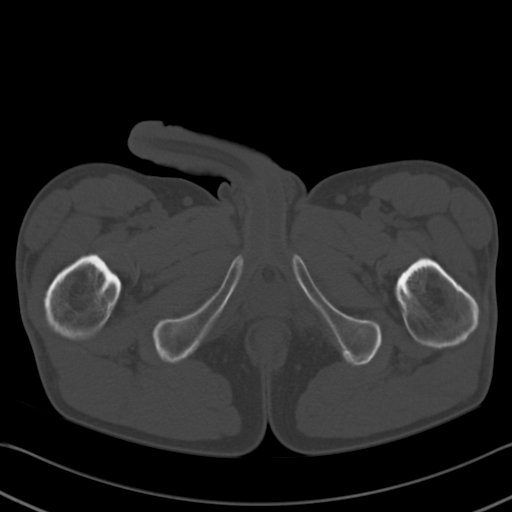
[im 21/93  soft-tissue]
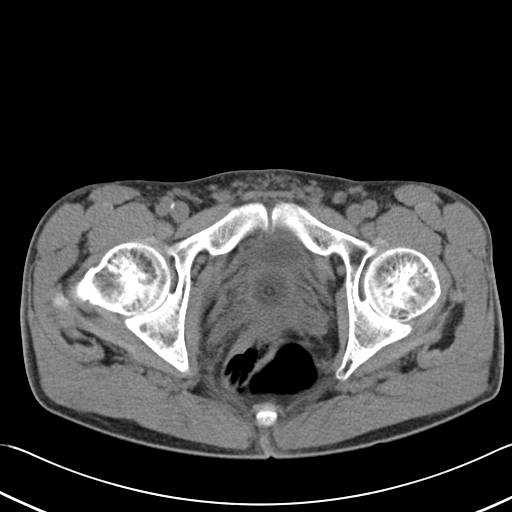
[im 31/93  soft-tissue]
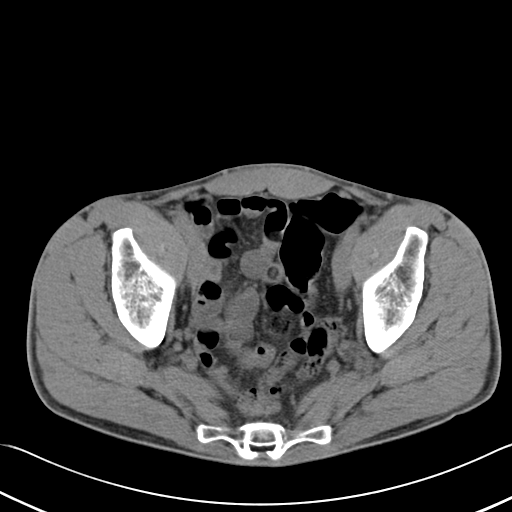
[im 41/93  soft-tissue]
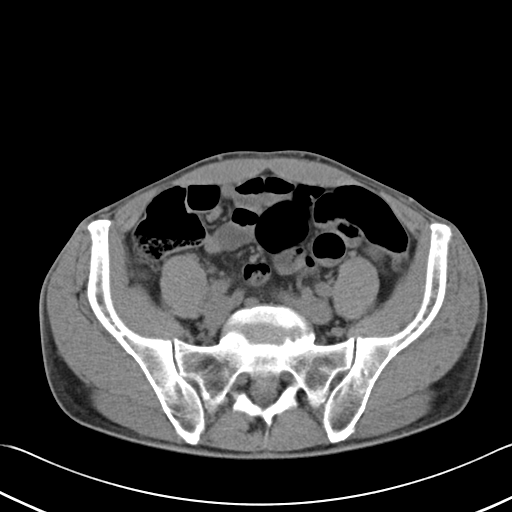
[im 52/93  soft-tissue]
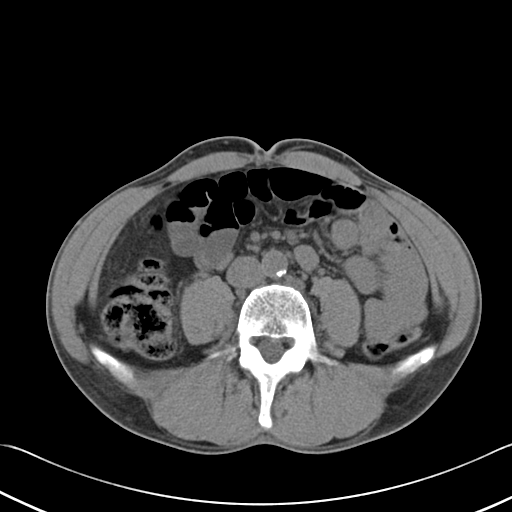
[im 52/93  lung]
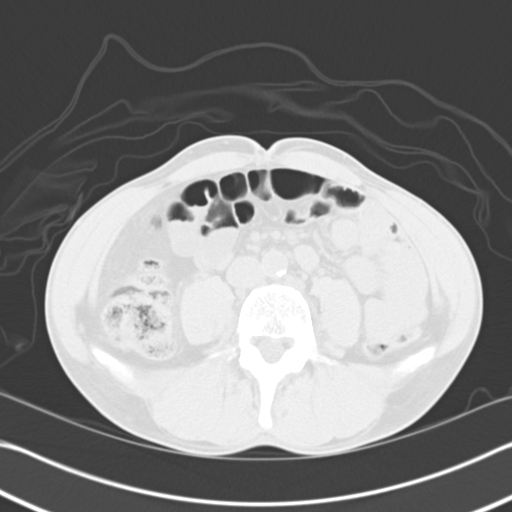
[im 62/93  soft-tissue]
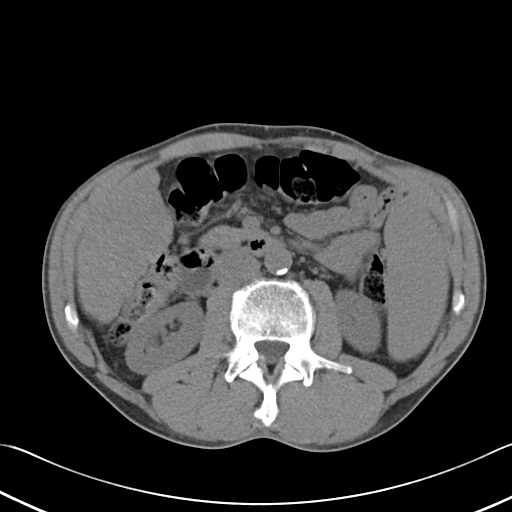
[im 62/93  lung]
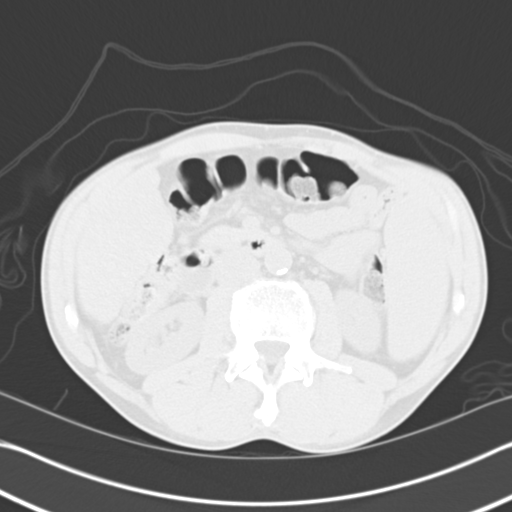
[im 72/93  soft-tissue]
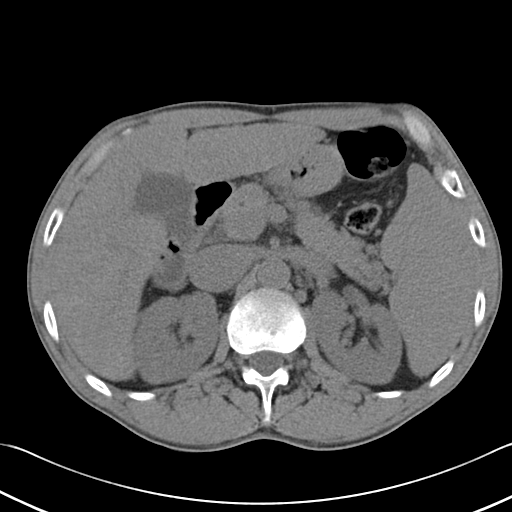
[im 72/93  lung]
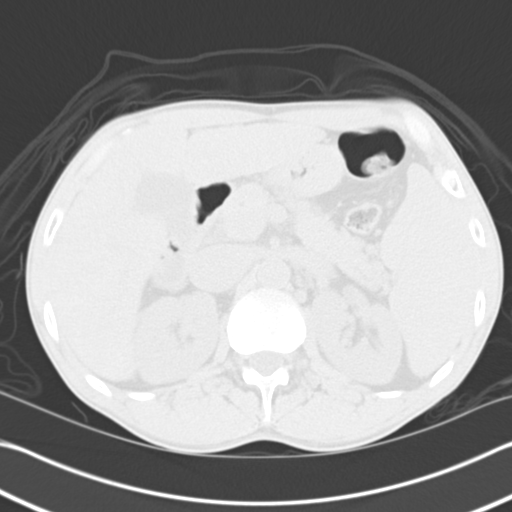
[im 82/93  soft-tissue]
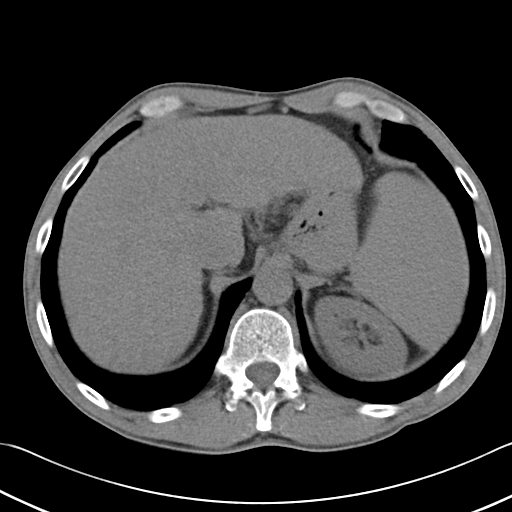
[im 82/93  lung]
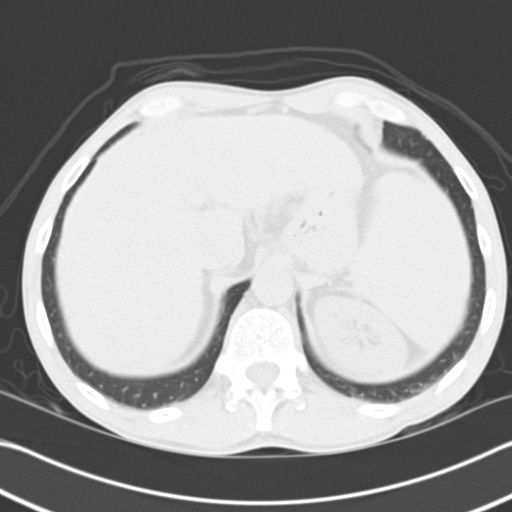

[Series 7: coronal · coronal · 0.68mm/px · 2 of 119 slices shown, 3 images]
[im 40/119  soft-tissue]
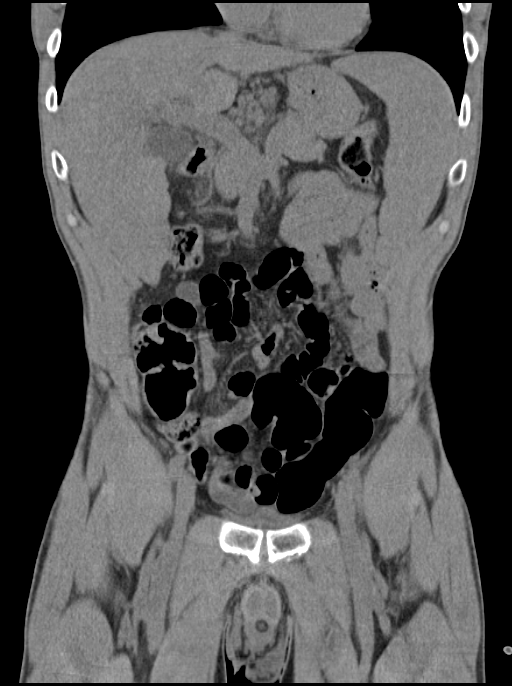
[im 40/119  bone]
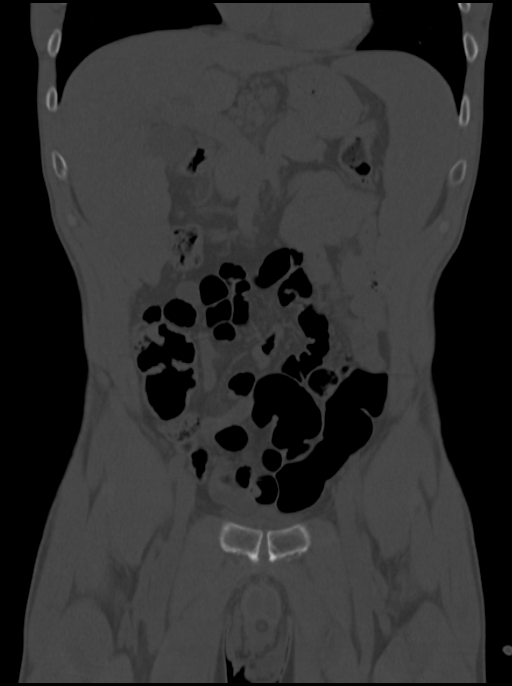
[im 79/119  soft-tissue]
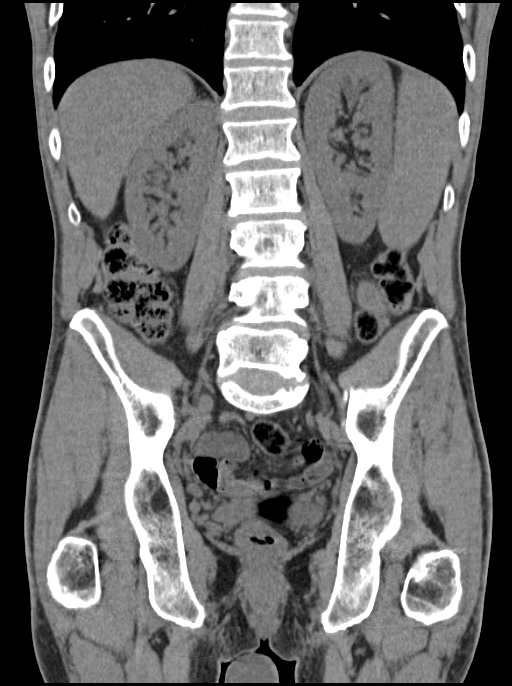

[10 of 46 positions shown; findings below may reference images not displayed]

FINDINGS: Lower chest: No significant pulmonary nodules or acute consolidative
airspace disease.

Hepatobiliary: Mild diffuse hepatic steatosis. Finely irregular
liver surface, indicating cirrhosis. No liver mass. Paraumbilical
and paraesophageal varices. Normal gallbladder with no radiopaque
cholelithiasis. No biliary ductal dilatation.

Pancreas:  Normal, with no pancreatic mass or duct dilation.

Spleen: Moderate splenomegaly (craniocaudal splenic length 16.9 cm,
previously 15.6 cm, mildly increased). No splenic mass.

Adrenals/Urinary Tract: Normal adrenals. At least 2 punctate
nonobstructing 1 mm stones in the interpolar left kidney. No right
renal stones. No hydronephrosis. No renal cortical masses. On
delayed imaging, there is no urothelial wall thickening and there
are no filling defects in the opacified portions of the bilateral
collecting systems or ureters, noting non opacification of
significant portions of the right greater than left ureters,
limiting evaluation in these locations. No bladder wall thickening.
Multiple small hyperdense structures in the bladder, at least 1 of
which appears to layer on the prone delayed sequence, probably
representing blood clots. Urinary bladder is nearly collapsed by
indwelling Foley catheter.

Stomach/Bowel: Grossly normal stomach. Normal caliber small bowel
with no small bowel wall thickening. Normal appendix. Normal large
bowel with no diverticulosis, large bowel wall thickening or
pericolonic fat stranding.

Vascular/Lymphatic: Atherosclerotic nonaneurysmal abdominal aorta.
Patent portal, splenic, hepatic and renal veins. No pathologically
enlarged lymph nodes in the abdomen or pelvis.

Reproductive: Normal size prostate.

Other: No pneumoperitoneum, ascites or focal fluid collection.

Musculoskeletal: No aggressive appearing focal osseous lesions. Mild
degenerative changes in the visualized thoracolumbar spine.
IMPRESSION: 1. Punctate nonobstructing left renal stones.  No hydronephrosis .
2. No renal cortical masses. No upper tract urothelial lesions, with
limitations as described.
3. Several small hyperdense structures in the posterior bladder,
probably blood clots, recommend correlation with cystoscopy to
exclude a bladder lesion.
4. No evidence of bowel obstruction or acute bowel inflammation.
Normal appendix.
5. Cirrhosis. Diffuse hepatic steatosis. Sequela of chronic portal
hypertension including paraesophageal and paraumbilical varices and
moderate splenomegaly, mildly increased. No ascites.

## 2019-07-29 IMAGING — CR DG CHEST 1V
1 series · 1 of 1 positions shown · non-contrast
Comparison: None.

CLINICAL DATA: Pain following fall

EXAM:
CHEST  1 VIEW

[dg chest 1 view]
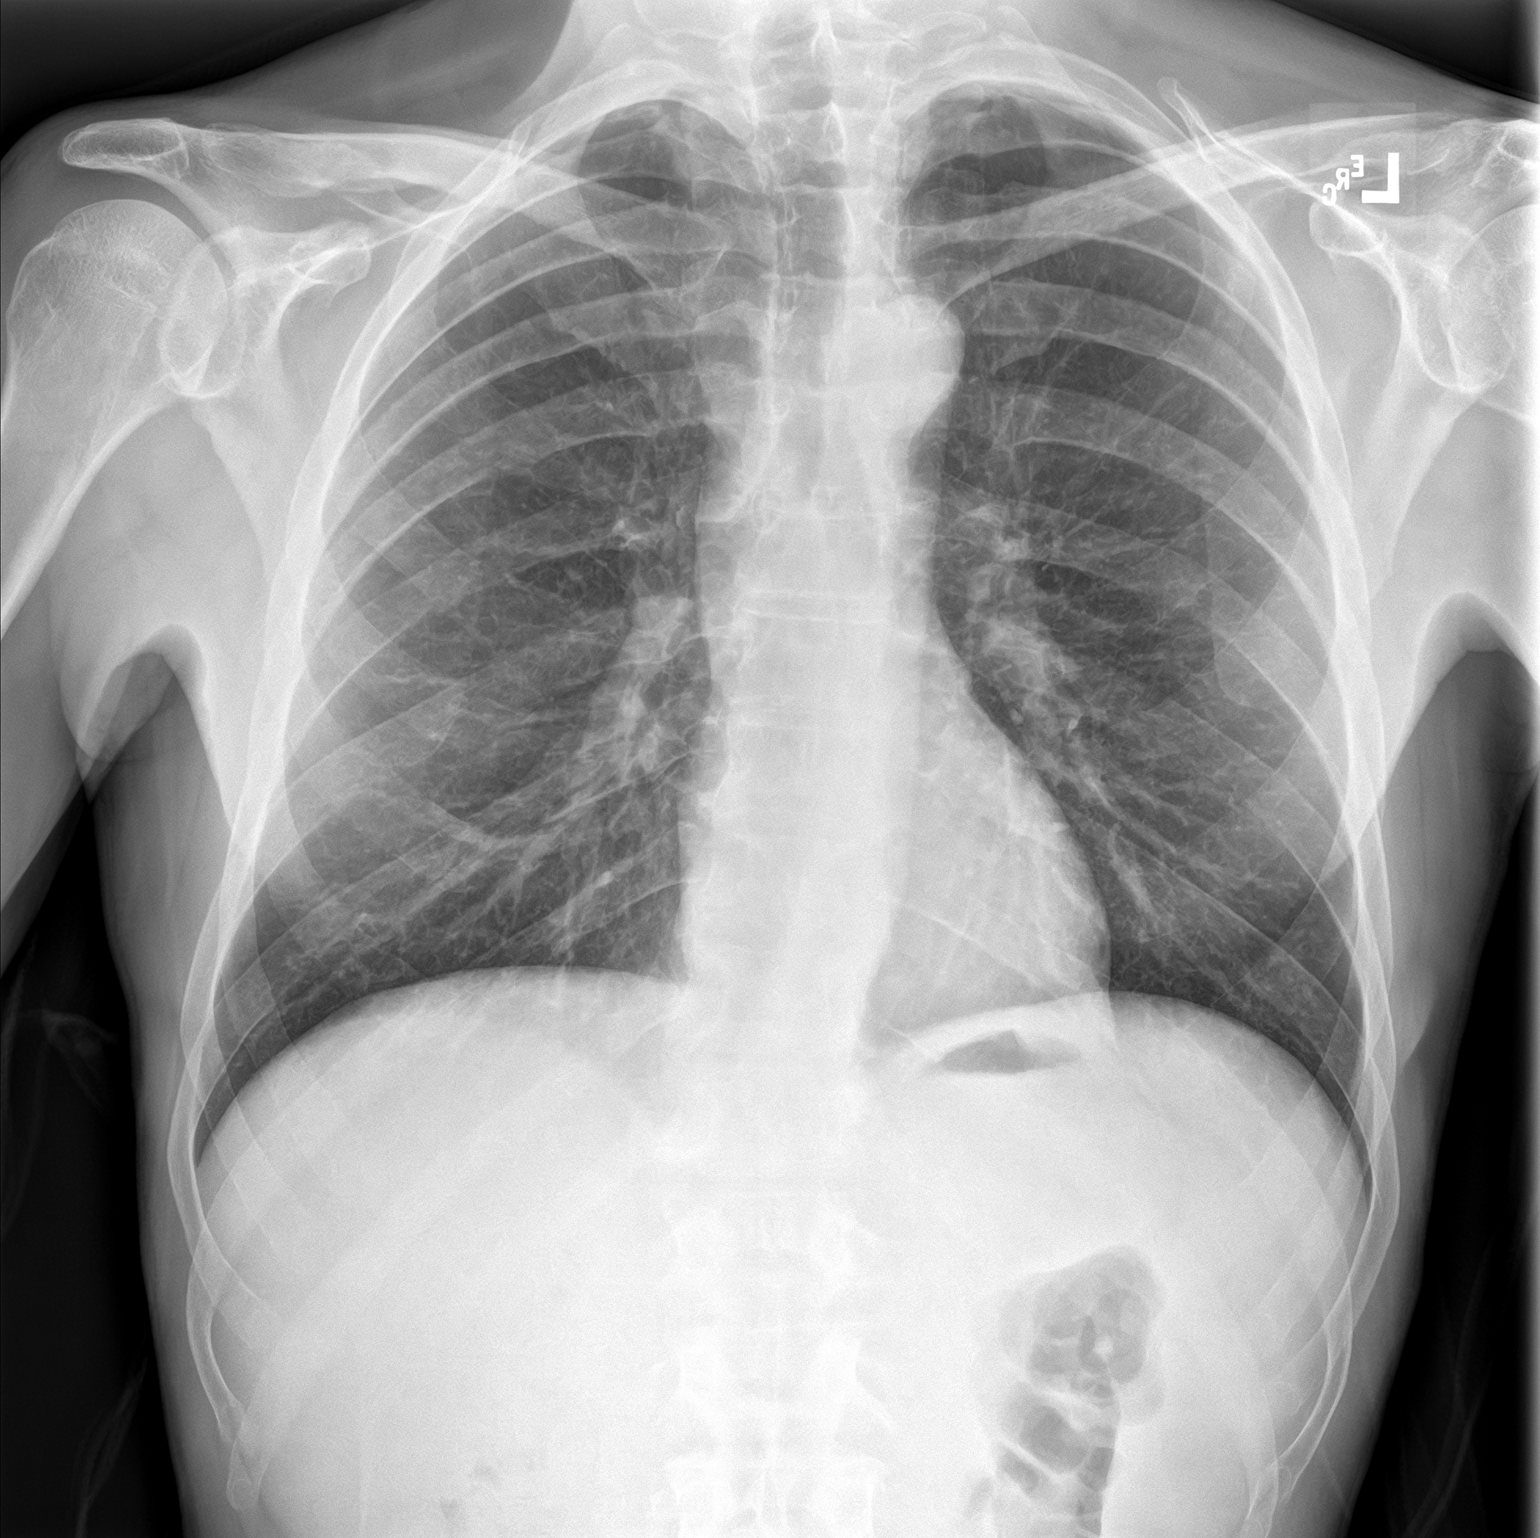

[1 of 1 positions shown; findings below may reference images not displayed]

FINDINGS: Lungs are clear. Heart size and pulmonary vascularity are normal. No
adenopathy. No pneumothorax. No bone lesions.
IMPRESSION: No edema or consolidation.
